# Patient Record
Sex: Female | Born: 1980 | Race: White | Hispanic: No | Marital: Single | Smoking: Current every day smoker
Health system: Southern US, Community
[De-identification: ages and names within clinical notes are randomized; demographics above are authoritative.]

## PROBLEM LIST (undated history)

## (undated) DIAGNOSIS — F329 Major depressive disorder, single episode, unspecified: Secondary | ICD-10-CM

## (undated) DIAGNOSIS — E559 Vitamin D deficiency, unspecified: Secondary | ICD-10-CM

## (undated) DIAGNOSIS — F4310 Post-traumatic stress disorder, unspecified: Secondary | ICD-10-CM

## (undated) DIAGNOSIS — F32A Depression, unspecified: Secondary | ICD-10-CM

## (undated) DIAGNOSIS — G5600 Carpal tunnel syndrome, unspecified upper limb: Secondary | ICD-10-CM

## (undated) DIAGNOSIS — F419 Anxiety disorder, unspecified: Secondary | ICD-10-CM

## (undated) DIAGNOSIS — E0500 Thyrotoxicosis with diffuse goiter without thyrotoxic crisis or storm: Secondary | ICD-10-CM

## (undated) HISTORY — DX: Anxiety disorder, unspecified: F41.9

## (undated) HISTORY — DX: Major depressive disorder, single episode, unspecified: F32.9

## (undated) HISTORY — DX: Post-traumatic stress disorder, unspecified: F43.10

## (undated) HISTORY — DX: Thyrotoxicosis with diffuse goiter without thyrotoxic crisis or storm: E05.00

## (undated) HISTORY — PX: LEEP: SHX91

## (undated) HISTORY — PX: DILATION AND CURETTAGE OF UTERUS: SHX78

## (undated) HISTORY — DX: Vitamin D deficiency, unspecified: E55.9

## (undated) HISTORY — DX: Depression, unspecified: F32.A

## (undated) HISTORY — DX: Carpal tunnel syndrome, unspecified upper limb: G56.00

---

## 2015-06-14 ENCOUNTER — Encounter (HOSPITAL_COMMUNITY): Payer: Self-pay | Admitting: Psychiatry

## 2015-06-14 ENCOUNTER — Encounter (INDEPENDENT_AMBULATORY_CARE_PROVIDER_SITE_OTHER): Payer: Self-pay

## 2015-06-14 ENCOUNTER — Ambulatory Visit (INDEPENDENT_AMBULATORY_CARE_PROVIDER_SITE_OTHER): Payer: 59 | Admitting: Psychiatry

## 2015-06-14 VITALS — BP 118/78 | HR 86 | Ht 62.0 in | Wt 190.8 lb

## 2015-06-14 DIAGNOSIS — F322 Major depressive disorder, single episode, severe without psychotic features: Secondary | ICD-10-CM

## 2015-06-14 DIAGNOSIS — F411 Generalized anxiety disorder: Secondary | ICD-10-CM

## 2015-06-14 DIAGNOSIS — F4310 Post-traumatic stress disorder, unspecified: Secondary | ICD-10-CM | POA: Insufficient documentation

## 2015-06-14 DIAGNOSIS — F17210 Nicotine dependence, cigarettes, uncomplicated: Secondary | ICD-10-CM | POA: Diagnosis not present

## 2015-06-14 DIAGNOSIS — G4700 Insomnia, unspecified: Secondary | ICD-10-CM | POA: Insufficient documentation

## 2015-06-14 MED ORDER — TRAZODONE HCL 50 MG PO TABS: ORAL_TABLET | ORAL | Status: DC

## 2015-06-14 MED ORDER — PAROXETINE HCL 20 MG PO TABS: 20.0000 mg | ORAL_TABLET | Freq: Every day | ORAL | Status: DC
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
## 2015-06-26 ENCOUNTER — Other Ambulatory Visit: Payer: Self-pay | Admitting: Internal Medicine

## 2015-06-26 ENCOUNTER — Other Ambulatory Visit: Payer: Self-pay

## 2015-06-26 DIAGNOSIS — E0500 Thyrotoxicosis with diffuse goiter without thyrotoxic crisis or storm: Secondary | ICD-10-CM

## 2015-06-26 DIAGNOSIS — E042 Nontoxic multinodular goiter: Secondary | ICD-10-CM

## 2015-07-04 ENCOUNTER — Ambulatory Visit
Admission: RE | Admit: 2015-07-04 | Discharge: 2015-07-04 | Disposition: A | Discharge status: Home / Self Care | Payer: 59 | Source: Ambulatory Visit

## 2015-07-04 DIAGNOSIS — E0500 Thyrotoxicosis with diffuse goiter without thyrotoxic crisis or storm: Secondary | ICD-10-CM

## 2015-07-04 DIAGNOSIS — E042 Nontoxic multinodular goiter: Secondary | ICD-10-CM

## 2015-07-26 ENCOUNTER — Encounter (HOSPITAL_COMMUNITY): Payer: Self-pay | Admitting: Psychiatry

## 2015-07-26 ENCOUNTER — Ambulatory Visit (INDEPENDENT_AMBULATORY_CARE_PROVIDER_SITE_OTHER): Payer: 59 | Admitting: Psychiatry

## 2015-07-26 VITALS — BP 123/80 | HR 102 | Ht 62.0 in | Wt 196.2 lb

## 2015-07-26 DIAGNOSIS — G4700 Insomnia, unspecified: Secondary | ICD-10-CM

## 2015-07-26 DIAGNOSIS — F4310 Post-traumatic stress disorder, unspecified: Secondary | ICD-10-CM | POA: Diagnosis not present

## 2015-07-26 DIAGNOSIS — F322 Major depressive disorder, single episode, severe without psychotic features: Secondary | ICD-10-CM

## 2015-07-26 DIAGNOSIS — F411 Generalized anxiety disorder: Secondary | ICD-10-CM

## 2015-07-26 MED ORDER — PAROXETINE HCL 40 MG PO TABS: 40.0000 mg | ORAL_TABLET | Freq: Every day | ORAL | Status: DC

## 2015-07-26 MED ORDER — TRAZODONE HCL 50 MG PO TABS: ORAL_TABLET | ORAL | Status: DC
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
## 2015-08-02 ENCOUNTER — Telehealth (HOSPITAL_COMMUNITY): Payer: Self-pay

## 2015-08-02 DIAGNOSIS — F4310 Post-traumatic stress disorder, unspecified: Secondary | ICD-10-CM

## 2015-08-02 DIAGNOSIS — F322 Major depressive disorder, single episode, severe without psychotic features: Secondary | ICD-10-CM

## 2015-08-02 DIAGNOSIS — G4700 Insomnia, unspecified: Secondary | ICD-10-CM

## 2015-08-02 DIAGNOSIS — F411 Generalized anxiety disorder: Secondary | ICD-10-CM

## 2015-08-02 MED ORDER — TRAZODONE HCL 50 MG PO TABS: ORAL_TABLET | ORAL | Status: AC

## 2015-08-02 MED ORDER — PAROXETINE HCL 40 MG PO TABS: 40.0000 mg | ORAL_TABLET | Freq: Every day | ORAL | Status: AC

## 2015-08-02 NOTE — Telephone Encounter (Signed)
Medication refill request - Faxed receivd from [facility] requesting a 90 day order for patient's prescribed Paroxetine and Trazodone.

## 2015-08-02 NOTE — Telephone Encounter (Signed)
Yes 90 day is fine

## 2015-08-02 NOTE — Telephone Encounter (Signed)
Submitted new orders to [facility] for 90 day supply of Trazadone and Paxil

## 2015-08-12 ENCOUNTER — Other Ambulatory Visit (HOSPITAL_COMMUNITY): Payer: Self-pay | Admitting: Psychiatry

## 2015-09-25 ENCOUNTER — Ambulatory Visit (HOSPITAL_COMMUNITY): Payer: 59 | Admitting: Psychiatry

## 2015-10-16 ENCOUNTER — Ambulatory Visit (HOSPITAL_COMMUNITY): Payer: 59 | Admitting: Psychiatry

## 2015-10-30 ENCOUNTER — Ambulatory Visit (HOSPITAL_COMMUNITY): Payer: 59 | Admitting: Psychiatry

## 2015-11-19 ENCOUNTER — Emergency Department (HOSPITAL_COMMUNITY)
Admission: EM | Admit: 2015-11-19 | Discharge: 2015-11-19 | Disposition: A | Discharge status: Home / Self Care | Payer: Self-pay | Attending: Emergency Medicine | Admitting: Emergency Medicine

## 2015-11-19 ENCOUNTER — Encounter (HOSPITAL_COMMUNITY): Payer: Self-pay | Admitting: *Deleted

## 2015-11-19 DIAGNOSIS — L02211 Cutaneous abscess of abdominal wall: Secondary | ICD-10-CM | POA: Insufficient documentation

## 2015-11-19 DIAGNOSIS — F17210 Nicotine dependence, cigarettes, uncomplicated: Secondary | ICD-10-CM | POA: Insufficient documentation

## 2015-11-19 DIAGNOSIS — L02413 Cutaneous abscess of right upper limb: Secondary | ICD-10-CM | POA: Insufficient documentation

## 2015-11-19 NOTE — ED Notes (Signed)
Pt reports abscess to right forearm and right abdomen for 1 week. Hx of MRSA. Denies drainage or fevers.

## 2015-11-19 NOTE — ED Notes (Signed)
No answer x1

## 2015-11-20 ENCOUNTER — Emergency Department (HOSPITAL_COMMUNITY)
Admission: EM | Admit: 2015-11-20 | Discharge: 2015-11-20 | Disposition: A | Discharge status: Home / Self Care | Payer: Self-pay | Attending: Emergency Medicine | Admitting: Emergency Medicine

## 2015-11-20 ENCOUNTER — Encounter (HOSPITAL_COMMUNITY): Payer: Self-pay | Admitting: *Deleted

## 2015-11-20 DIAGNOSIS — L02413 Cutaneous abscess of right upper limb: Secondary | ICD-10-CM

## 2015-11-20 DIAGNOSIS — F17210 Nicotine dependence, cigarettes, uncomplicated: Secondary | ICD-10-CM | POA: Insufficient documentation

## 2015-11-20 DIAGNOSIS — IMO0002 Reserved for concepts with insufficient information to code with codable children: Secondary | ICD-10-CM

## 2015-11-20 DIAGNOSIS — L02211 Cutaneous abscess of abdominal wall: Secondary | ICD-10-CM | POA: Insufficient documentation

## 2015-11-20 DIAGNOSIS — F329 Major depressive disorder, single episode, unspecified: Secondary | ICD-10-CM | POA: Insufficient documentation

## 2015-11-20 DIAGNOSIS — F4310 Post-traumatic stress disorder, unspecified: Secondary | ICD-10-CM | POA: Insufficient documentation

## 2015-11-20 DIAGNOSIS — Z79891 Long term (current) use of opiate analgesic: Secondary | ICD-10-CM | POA: Insufficient documentation

## 2015-11-20 DIAGNOSIS — Z79899 Other long term (current) drug therapy: Secondary | ICD-10-CM | POA: Insufficient documentation

## 2015-11-20 DIAGNOSIS — Z792 Long term (current) use of antibiotics: Secondary | ICD-10-CM | POA: Insufficient documentation

## 2015-11-20 LAB — BASIC METABOLIC PANEL
Anion gap: 8 (ref 5–15)
BUN: 11 mg/dL (ref 6–20)
CO2: 23 mmol/L (ref 22–32)
Calcium: 9.3 mg/dL (ref 8.9–10.3)
Chloride: 108 mmol/L (ref 101–111)
Creatinine, Ser: 0.67 mg/dL (ref 0.44–1.00)
GFR calc Af Amer: >60 mL/min (ref >60)
GFR calc non Af Amer: >60 mL/min (ref >60)
Glucose, Bld: 96 mg/dL (ref 65–99)
Potassium: 3.9 mmol/L (ref 3.5–5.1)
Sodium: 139 mmol/L (ref 135–145)

## 2015-11-20 LAB — CBC WITH DIFFERENTIAL/PLATELET
Basophils Absolute: 0 10*3/uL (ref 0.0–0.1)
Basophils Relative: 0 %
Eosinophils Absolute: 0.3 10*3/uL (ref 0.0–0.7)
Eosinophils Relative: 3 %
HCT: 40.8 % (ref 36.0–46.0)
Hemoglobin: 14 g/dL (ref 12.0–15.0)
Lymphocytes Relative: 37 %
Lymphs Abs: 4.3 10*3/uL — ABNORMAL HIGH (ref 0.7–4.0)
MCH: 30.2 pg (ref 26.0–34.0)
MCHC: 34.3 g/dL (ref 30.0–36.0)
MCV: 88.1 fL (ref 78.0–100.0)
Monocytes Absolute: 0.6 10*3/uL (ref 0.1–1.0)
Monocytes Relative: 5 %
Neutro Abs: 6.5 10*3/uL (ref 1.7–7.7)
Neutrophils Relative %: 55 %
Platelets: 364 10*3/uL (ref 150–400)
RBC: 4.63 MIL/uL (ref 3.87–5.11)
RDW: 12.6 % (ref 11.5–15.5)
WBC: 11.7 10*3/uL — ABNORMAL HIGH (ref 4.0–10.5)

## 2015-11-20 LAB — URINALYSIS, ROUTINE W REFLEX MICROSCOPIC
Bilirubin Urine: NEGATIVE
Glucose, UA: NEGATIVE mg/dL
Ketones, ur: NEGATIVE mg/dL
Leukocytes, UA: NEGATIVE
Nitrite: NEGATIVE
Protein, ur: NEGATIVE mg/dL
Specific Gravity, Urine: 1.029 (ref 1.005–1.030)
pH: 5.5 (ref 5.0–8.0)

## 2015-11-20 LAB — URINE MICROSCOPIC-ADD ON

## 2015-11-20 MED ORDER — CHLORHEXIDINE GLUCONATE 4 % EX LIQD: Freq: Every day | CUTANEOUS | Status: AC | PRN

## 2015-11-20 MED ORDER — HYDROCODONE-ACETAMINOPHEN 5-325 MG PO TABS: 1.0000 | ORAL_TABLET | ORAL | Status: AC | PRN

## 2015-11-20 MED ORDER — DOXYCYCLINE HYCLATE 100 MG PO CAPS: 100.0000 mg | ORAL_CAPSULE | Freq: Two times a day (BID) | ORAL | Status: AC

## 2015-11-20 MED ORDER — MUPIROCIN CALCIUM 2 % NA OINT: TOPICAL_OINTMENT | NASAL | Status: AC

## 2015-11-20 MED ORDER — LIDOCAINE HCL 1 % IJ SOLN
5.0000 mL | Freq: Once | INTRAMUSCULAR | Status: AC
  Administered 2015-11-20: 5 mL
  Filled 2015-11-20: qty 20

## 2015-11-20 NOTE — Discharge Instructions (Signed)
Medications: Doxycycline, Norco  Treatment: Take doxycycline as prescribed for 1 week. You may take ibuprofen every 4-6 hours as needed for mild-moderate pain. You can take Norco every 4-6 hours as needed for severe pain. Use mupirocin in each nostril daily. After your wounds are healed, use Hibiclens on your entire body daily. It is important to drink 8 glasses of water daily and eat as tolerated.  Follow-up: Please see your primary care provider or return to emergency department 2-3 days for wound check. If you develop any fevers, increasing pain, redness, swelling, drainage, streaking from the wound, please return to emergency department as soon as possible, as these are signs of infection. If you continue to have lightheadedness after your infection has cleared, or you develop any new or worsening symptoms, please see her doctor or return to emergency department.   Abscess An abscess is an infected area that contains a collection of pus and debris.It can occur in almost any part of the body. An abscess is also known as a furuncle or boil. CAUSES  An abscess occurs when tissue gets infected. This can occur from blockage of oil or sweat glands, infection of hair follicles, or a minor injury to the skin. As the body tries to fight the infection, pus collects in the area and creates pressure under the skin. This pressure causes pain. People with weakened immune systems have difficulty fighting infections and get certain abscesses more often.  SYMPTOMS Usually an abscess develops on the skin and becomes a painful mass that is red, warm, and tender. If the abscess forms under the skin, you may feel a moveable soft area under the skin. Some abscesses break open (rupture) on their own, but most will continue to get worse without care. The infection can spread deeper into the body and eventually into the bloodstream, causing you to feel ill.  DIAGNOSIS  Your caregiver will take your medical history and  perform a physical exam. A sample of fluid may also be taken from the abscess to determine what is causing your infection. TREATMENT  Your caregiver may prescribe antibiotic medicines to fight the infection. However, taking antibiotics alone usually does not cure an abscess. Your caregiver may need to make a small cut (incision) in the abscess to drain the pus. In some cases, gauze is packed into the abscess to reduce pain and to continue draining the area. HOME CARE INSTRUCTIONS   Only take over-the-counter or prescription medicines for pain, discomfort, or fever as directed by your caregiver.  If you were prescribed antibiotics, take them as directed. Finish them even if you start to feel better.  If gauze is used, follow your caregiver's directions for changing the gauze.  To avoid spreading the infection:  Keep your draining abscess covered with a bandage.  Wash your hands well.  Do not share personal care items, towels, or whirlpools with others.  Avoid skin contact with others.  Keep your skin and clothes clean around the abscess.  Keep all follow-up appointments as directed by your caregiver. SEEK MEDICAL CARE IF:   You have increased pain, swelling, redness, fluid drainage, or bleeding.  You have muscle aches, chills, or a general ill feeling.  You have a fever. MAKE SURE YOU:   Understand these instructions.  Will watch your condition.  Will get help right away if you are not doing well or get worse.   This information is not intended to replace advice given to you by your health care provider. Make  sure you discuss any questions you have with your health care provider.   Document Released: 04/02/2005 Document Revised: 12/23/2011 Document Reviewed: 09/05/2011 Elsevier Interactive Patient Education 2016 Else[employer]  Incision and Drainage Incision and drainage is a procedure in which a sac-like structure (cystic structure) is opened and drained. The area to be  drained usually contains material such as pus, fluid, or blood.  LET YOUR CAREGIVER KNOW ABOUT:   Allergies to medicine.  Medicines taken, including vitamins, herbs, eyedrops, over-the-counter medicines, and creams.  Use of steroids (by mouth or creams).  Previous problems with anesthetics or numbing medicines.  History of bleeding problems or blood clots.  Previous surgery.  Other health problems, including diabetes and kidney problems.  Possibility of pregnancy, if this applies. RISKS AND COMPLICATIONS  Pain.  Bleeding.  Scarring.  Infection. BEFORE THE PROCEDURE  You may need to have an ultrasound or other imaging tests to see how large or deep your cystic structure is. Blood tests may also be used to determine if you have an infection or how severe the infection is. You may need to have a tetanus shot. PROCEDURE  The affected area is cleaned with a cleaning fluid. The cyst area will then be numbed with a medicine (local anesthetic). A small incision will be made in the cystic structure. A syringe or catheter may be used to drain the contents of the cystic structure, or the contents may be squeezed out. The area will then be flushed with a cleansing solution. After cleansing the area, it is often gently packed with a gauze or another wound dressing. Once it is packed, it will be covered with gauze and tape or some other type of wound dressing. AFTER THE PROCEDURE   Often, you will be allowed to go home right after the procedure.  You may be given antibiotic medicine to prevent or heal an infection.  If the area was packed with gauze or some other wound dressing, you will likely need to come back in 1 to 2 days to get it removed.  The area should heal in about 14 days.   This information is not intended to replace advice given to you by your health care provider. Make sure you discuss any questions you have with your health care provider.   Document Released: 12/17/2000  Document Revised: 12/23/2011 Document Reviewed: 08/18/2011 Elsevier Interactive Patient Education [employer]2016 [employer] Inc[employer]

## 2015-11-20 NOTE — ED Provider Notes (Signed)
CSN: [id number]     Arrival date & time 11/20/15  1251 History  By signing my name below, I, Summer Spears, attest that this documentation has been prepared under the direction and in the presence of non-physician practitioner, Summer ReamAlexandra Rollie Hynek, PA. Electronically Signed: Marisue HumbleMichelle Spears, Scribe. 11/20/2015. 2:16 PM.   Chief Complaint  Patient presents with  . Abscess    pt has an abcess on her right forearm and right upper abd area   The history is provided by the patient. No language interpreter was used.   HPI Comments:  Summer Spears is a 35 y.o. female with PMHx of MRSA, depression and anxiety who presents to the Emergency Department complaining of 8/10 painful abscess to right forearm and abdomen onset a week ago, worsening in the last few days. Pt reports associated constant light-headedness, increased breathing rate and decreased appetite for the past few days.Patient has not been drinking very many fluids either. No alleviating or exacerbating factors noted. She has washed the wounds with Dial soap and septic wash, and applied ointment and bandaids. Pt reports she has stopped taking her antidepressant in the past week. Denies fever, drainage from wounds, shortness of breath, chest pain, abdominal pain, nausea, vomiting or palpitations.  Past Medical History  Diagnosis Date  . Graves disease   . Vitamin D deficiency disease   . Depression   . Carpal tunnel syndrome   . PTSD (post-traumatic stress disorder)   . Anxiety    Past Surgical History  Procedure Laterality Date  . Dilation and curettage of uterus    . Leep     Family History  Problem Relation Age of Onset  . Bipolar disorder Mother   . Alcohol abuse Father    Social History  Substance Use Topics  . Smoking status: Current Every Day Smoker -- 0.50 packs/day    Types: Cigarettes  . Smokeless tobacco: Never Used  . Alcohol Use: Yes     Comment: 3 drinks per episode about twice a month   OB History    No  data available     Review of Systems  Constitutional: Negative for fever and chills.  HENT: Negative for facial swelling and sore throat.   Respiratory: Negative for shortness of breath.   Cardiovascular: Negative for chest pain and palpitations.  Gastrointestinal: Negative for nausea, vomiting and abdominal pain.  Genitourinary: Negative for dysuria.  Musculoskeletal: Negative for back pain.  Skin: Positive for wound (abscess). Negative for rash.  Neurological: Negative for headaches.  Psychiatric/Behavioral: The patient is not nervous/anxious.    Allergies  Sulfa antibiotics  Home Medications   Prior to Admission medications   Medication Sig Start Date End Date Taking? Authorizing Provider  Cholecalciferol (VITAMIN D3) 5000 UNIT/ML LIQD Take by mouth.   Yes Historical Provider, MD  PARoxetine (PAXIL) 40 MG tablet Take 1 tablet (40 mg total) by mouth daily. 08/02/15 08/01/16 Yes Summer DarterSalina Agarwal, MD  cyanocobalamin 100 MCG tablet Take 100 mcg by mouth daily.    Historical Provider, MD  desogestrel-ethinyl estradiol (APRI,EMOQUETTE,SOLIA) 0.15-30 MG-MCG tablet Take 1 tablet by mouth daily.    Historical Provider, MD  doxycycline (VIBRAMYCIN) 100 MG capsule Take 1 capsule (100 mg total) by mouth 2 (two) times daily. 11/20/15   Summer HolesAlexandra M Sundeep Cary, PA-C  HYDROcodone-acetaminophen (NORCO/VICODIN) 5-325 MG tablet Take 1-2 tablets by mouth every 4 (four) hours as needed. 11/20/15   Summer HolesAlexandra M [name], PA-C  traZODone (DESYREL) 50 MG tablet Take 1-2 tabs po qhs prn Insomnia 08/02/15  Summer Darter, MD   BP 113/85 mmHg  Temp(Src) 98 F (36.7 C)  Resp 16  Ht 5\' 2"  (1.575 m)  Wt 190 lb (86.183 kg)  BMI 34.74 kg/m2  SpO2 100%  LMP 11/05/2015   Physical Exam  Constitutional: She appears well-developed and well-nourished. No distress.  HENT:  Head: Normocephalic and atraumatic.  Mouth/Throat: Oropharynx is clear and moist. No oropharyngeal exudate.  Eyes: Conjunctivae are normal. Pupils are  equal, round, and reactive to light. Right eye exhibits no discharge. Left eye exhibits no discharge. No scleral icterus.  Neck: Normal range of motion. Neck supple. No thyromegaly present.  Cardiovascular: Normal rate, regular rhythm, normal heart sounds and intact distal pulses.  Exam reveals no gallop and no friction rub.   No murmur heard. Pulmonary/Chest: Effort normal and breath sounds normal. No stridor. No respiratory distress. She has no wheezes. She has no rales.  Abdominal: Soft. Bowel sounds are normal. She exhibits no distension. There is no tenderness. There is no rebound and no guarding.    Musculoskeletal: She exhibits no edema.       Arms: Normal sensation in right arm and hand, equal bilateral grip strength; cap refill<2secs  Lymphadenopathy:    She has no cervical adenopathy.  Neurological: She is alert. Coordination normal.  Skin: Skin is warm and dry. No rash noted. She is not diaphoretic. No pallor.  Psychiatric: She has a normal mood and affect.  Nursing note and vitals reviewed.   ED Course  Procedures  DIAGNOSTIC STUDIES:  Oxygen Saturation is 100% on RA, normal by my interpretation.    COORDINATION OF CARE:  2:09 PM Will I&D abscess on right foream and start pt on antibiotics. Instructed pt to return for wound check in 2-3 days. Discussed treatment plan with pt at bedside and pt agreed to plan.  Labs Review Labs Reviewed  CBC WITH DIFFERENTIAL/PLATELET - Abnormal; Notable for the following:    WBC 11.7 (*)    Lymphs Abs 4.3 (*)    All other components within normal limits  URINALYSIS, ROUTINE W REFLEX MICROSCOPIC (NOT AT [facility]) - Abnormal; Notable for the following:    Color, Urine AMBER (*)    APPearance CLOUDY (*)    Hgb urine dipstick TRACE (*)    All other components within normal limits  URINE MICROSCOPIC-ADD ON - Abnormal; Notable for the following:    Squamous Epithelial / LPF 0-5 (*)    Bacteria, UA MANY (*)    All other components within  normal limits  BASIC METABOLIC PANEL  POC URINE PREG, ED    Imaging Review No results found. I have personally reviewed and evaluated these images and lab results as part of my medical decision-making.   EKG Interpretation None     INCISION AND DRAINAGE PROCEDURE NOTE: Patient identification was confirmed and verbal consent was obtained. This procedure was performed by Summer Ream, PA at 3:17 PM. Site: R forearm Sterile procedures observed Needle size: 27 Anesthetic used (type and amt): 1cc Blade size: 11 Drainage: copious Complexity: Complex Site anesthetized, incision made over site, wound drained and explored loculations, rinsed with copious amounts of normal saline, wound packed with sterile gauze, covered with dry, sterile dressing.  Pt tolerated procedure well without complications.  Instructions for care discussed verbally and pt provided with additional written instructions for homecare and f/u.  INCISION AND DRAINAGE PROCEDURE NOTE: Patient identification was confirmed and verbal consent was obtained. This procedure was performed by Summer Ream, PA at 3:17 PM.  Site: Lower abdomen Sterile procedures observed Needle size: 27 Anesthetic used (type and amt): 1cc Blade size: 11 Drainage: scant Complexity: Complex Site anesthetized, incision made over site, wound drained and explored loculations, rinsed with copious amounts of normal saline, wound packed with sterile gauze, covered with dry, sterile dressing.  Pt tolerated procedure well without complications.  Instructions for care discussed verbally and pt provided with additional written instructions for homecare and f/u.  MDM   Patient with skin abscess. Patient history of MRSA. Incision and drainage performed in the ED today with good relief of patient pain.  Abscess was not large enough to warrant packing or drain placement. Wound recheck in 2 days. Supportive care and return precautions discussed.  Pt  sent home with doxycycline and Norco. Due to patient's lightheadedness since onset, screening labs were done. CBC shows WBC 11.7. BMP unremarkable. UA shows many bacteria, but no leukocytes. Urine culture sent and would treat if positive for UTI. Patient advised she would hear in 2-3 days if she needed to be started on antibiotics. Patient denies urinary symptoms at this time. The patient appears reasonably screened and/or stabilized for discharge and I doubt any other emergent medical condition requiring further screening, evaluation, or treatment in the ED prior to discharge. Patient advised to follow up with PCP or return to the emergency department for wound check in 2 days. Patient advised to follow up with PCP if her lightheadedness does not resolve following resolution of infection. Patient vitals stable throughout ED course and discharged in satisfactory condition    Final diagnoses:  Abscess of arm, right  Abscess, abdomen (HCC)    I personally performed the services described in this documentation, which was scribed in my presence. The recorded information has been reviewed and is accurate.    Summer Holes, PA-C 11/20/15 1651  Raeford Razor, MD 11/20/15 [phone]

## 2015-11-20 NOTE — ED Notes (Signed)
Urine specimen - negative for pregnancy

## 2015-11-20 NOTE — ED Notes (Signed)
Pt c/o abscess to right forearm, and stomach, sts had similar symptoms in 2015 ans was positive for MRSA

## 2015-11-20 NOTE — ED Notes (Signed)
Pt states she has a hx of MURSA

## 2015-11-21 LAB — URINE CULTURE

## 2015-11-25 ENCOUNTER — Other Ambulatory Visit (HOSPITAL_COMMUNITY): Payer: Self-pay | Admitting: Psychiatry

## 2017-02-05 IMAGING — US US SOFT TISSUE HEAD/NECK
1 series · 14 of 25 positions shown · non-contrast
Comparison: Outside ultrasound, not available.

CLINICAL DATA: Graves disease.  History of thyroid nodule.

EXAM:
THYROID ULTRASOUND
TECHNIQUE: Ultrasound examination of the thyroid gland and adjacent soft
tissues was performed.

[Series 1: us soft tissue head/neck · 0.06mm/px · 14 of 37 slices shown]
[im 1/37]
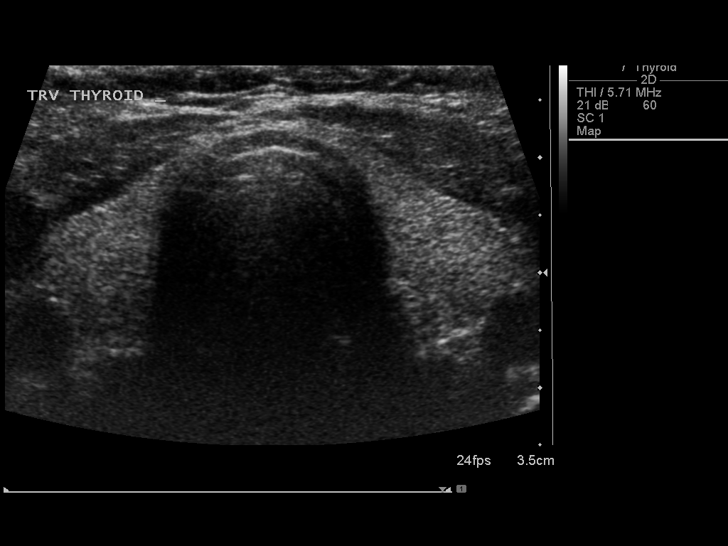
[im 4/37]
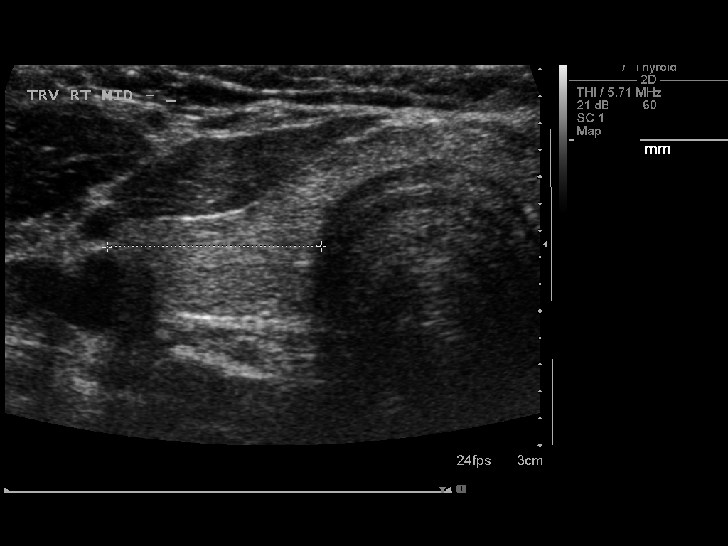
[im 7/37]
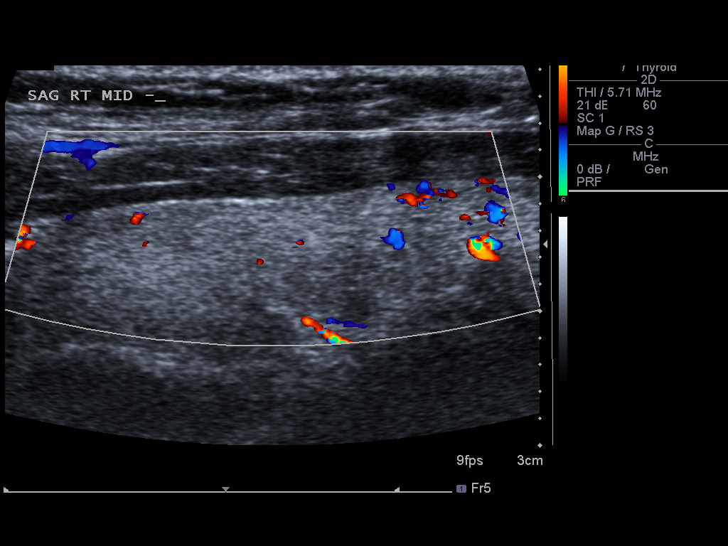
[im 10/37]
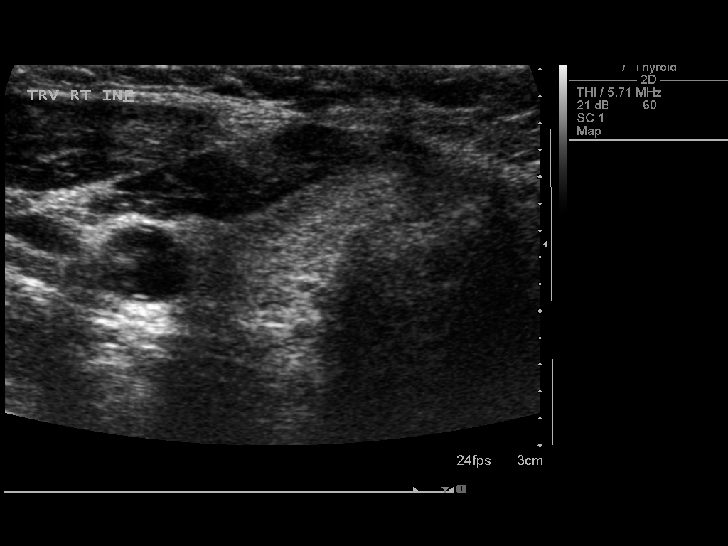
[im 13/37]
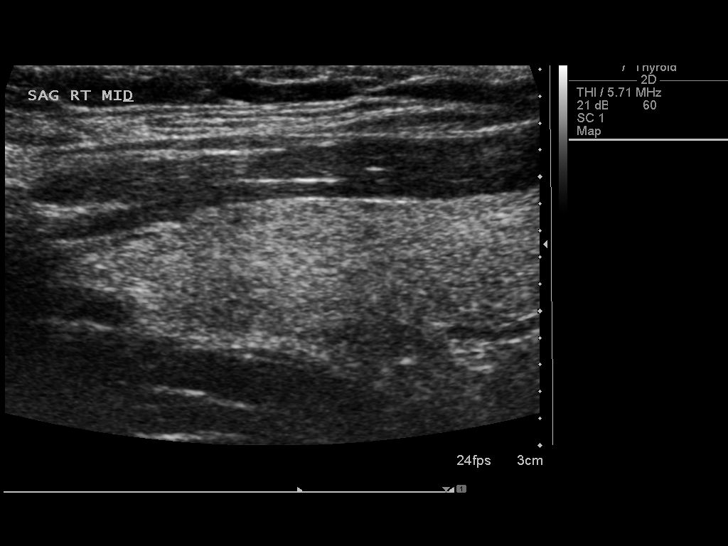
[im 14/37]
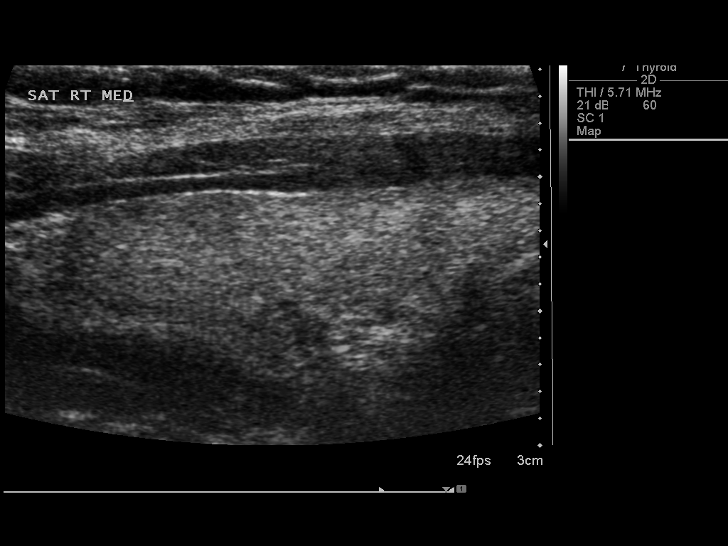
[im 17/37]
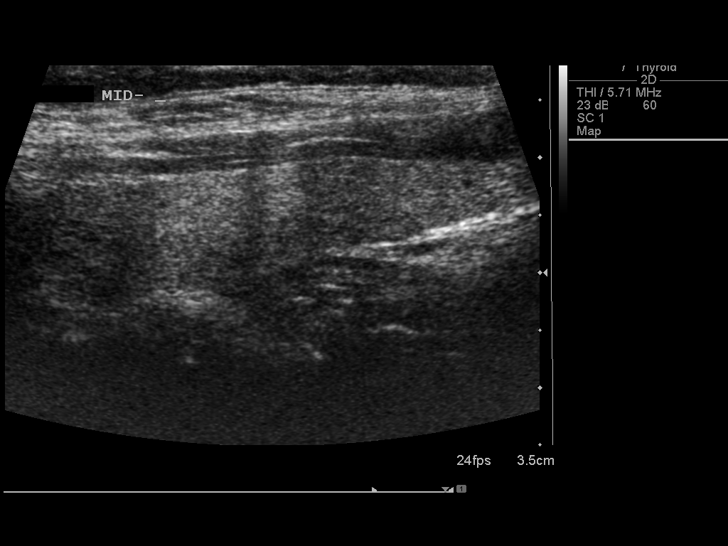
[im 20/37]
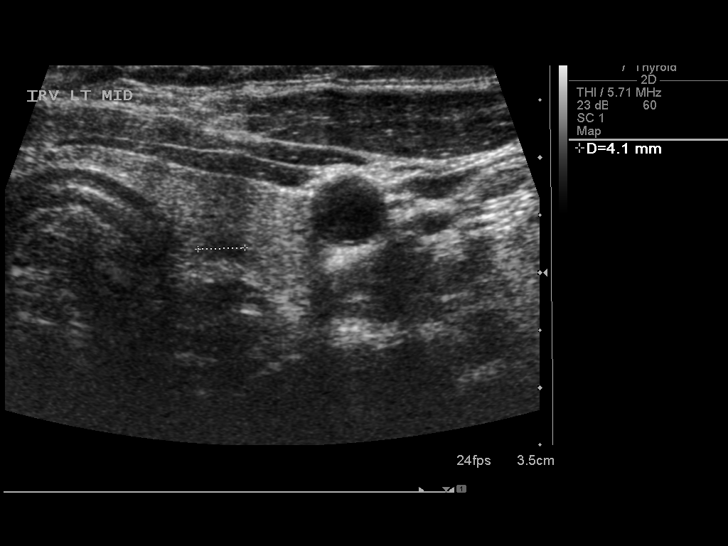
[im 23/37]
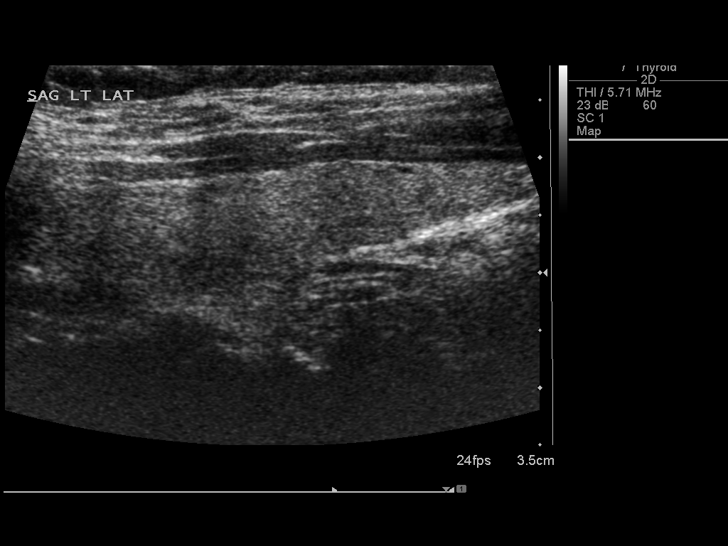
[im 25/37]
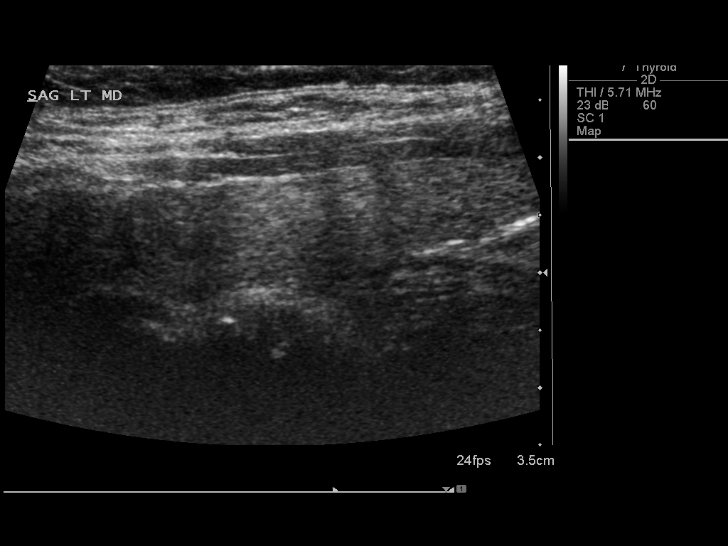
[im 28/37]
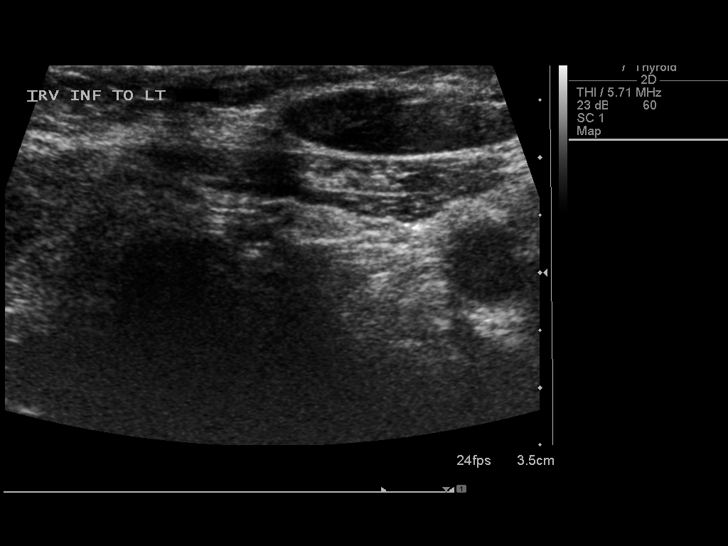
[im 31/37]
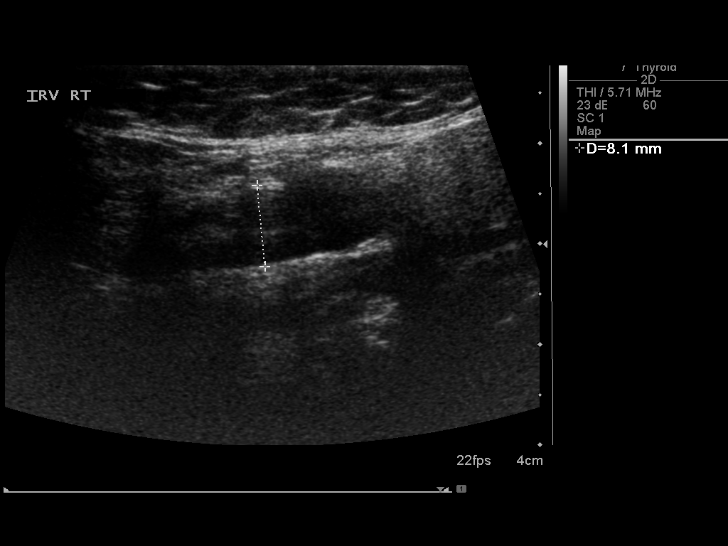
[im 34/37]
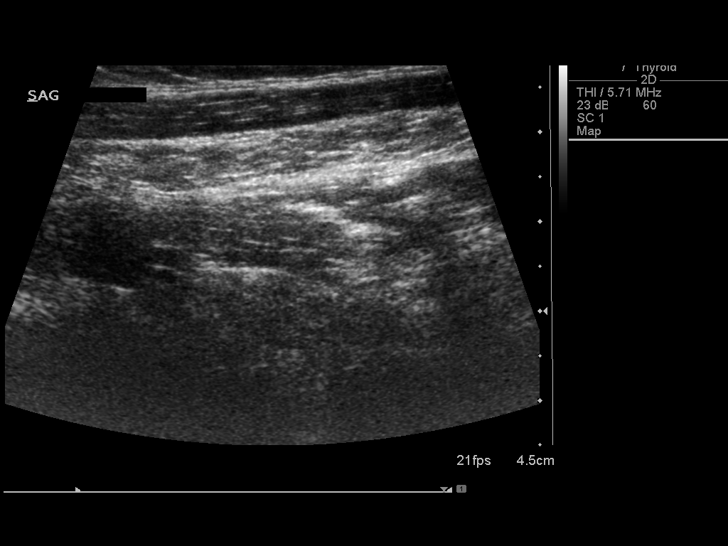
[im 37/37]
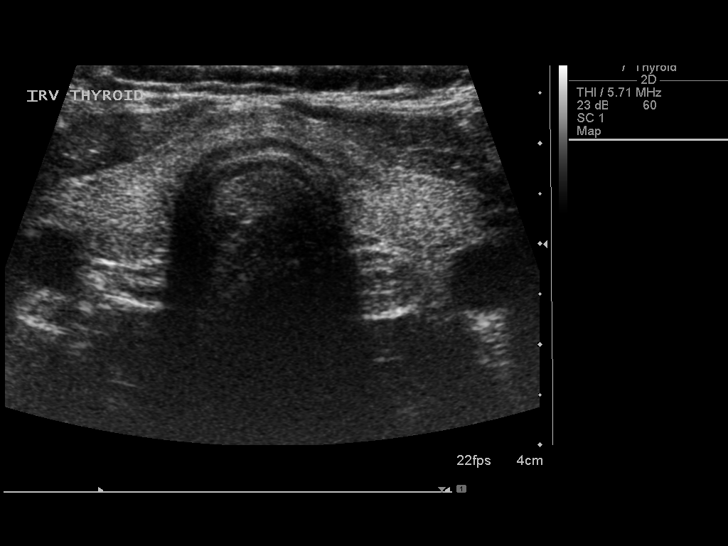

[14 of 25 positions shown; findings below may reference images not displayed]

FINDINGS: Right thyroid lobe

Measurements: 5.2 x 0.9 x 1.6 cm. Right thyroid tissue is
homogeneous without a focal nodule.

Left thyroid lobe

Measurements: 4.5 x 1.1 x 1.6 cm. Small round hypoechoic area along
the mid left thyroid lobe appears to represent a small nodule. This
measures 0.4 cm. No other definite nodules.

Isthmus

Thickness: 0.3 cm.  No nodules visualized.

Lymphadenopathy

None visualized.
IMPRESSION: Small left thyroid nodule, measuring 0.4 cm. Findings do not meet
current SRU consensus criteria for biopsy. Follow-up by clinical
exam is recommended. If patient has known risk factors for thyroid
carcinoma, consider follow-up ultrasound in 12 months. If patient is
clinically hyperthyroid, consider nuclear medicine thyroid uptake
and scan.Reference: Management of Thyroid Nodules Detected at US:
Society of Radiologists in Ultrasound Consensus Conference
